# Patient Record
Sex: Male | Born: 2000 | Race: White | Hispanic: No | Marital: Single | Smoking: Former smoker
Health system: Southern US, Community
[De-identification: ages and names within clinical notes are randomized; demographics above are authoritative.]

## PROBLEM LIST (undated history)

## (undated) DIAGNOSIS — Q600 Renal agenesis, unilateral: Secondary | ICD-10-CM

## (undated) HISTORY — PX: TONSILLECTOMY: SUR1361

---
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
## 2021-03-20 IMAGING — DX DG CHEST 2V
2 series · 2 of 2 positions shown · non-contrast
Comparison: 08/13/2003

CLINICAL DATA: 19-year-old male with chest pain

EXAM:
CHEST - 2 VIEW

[chest pa]
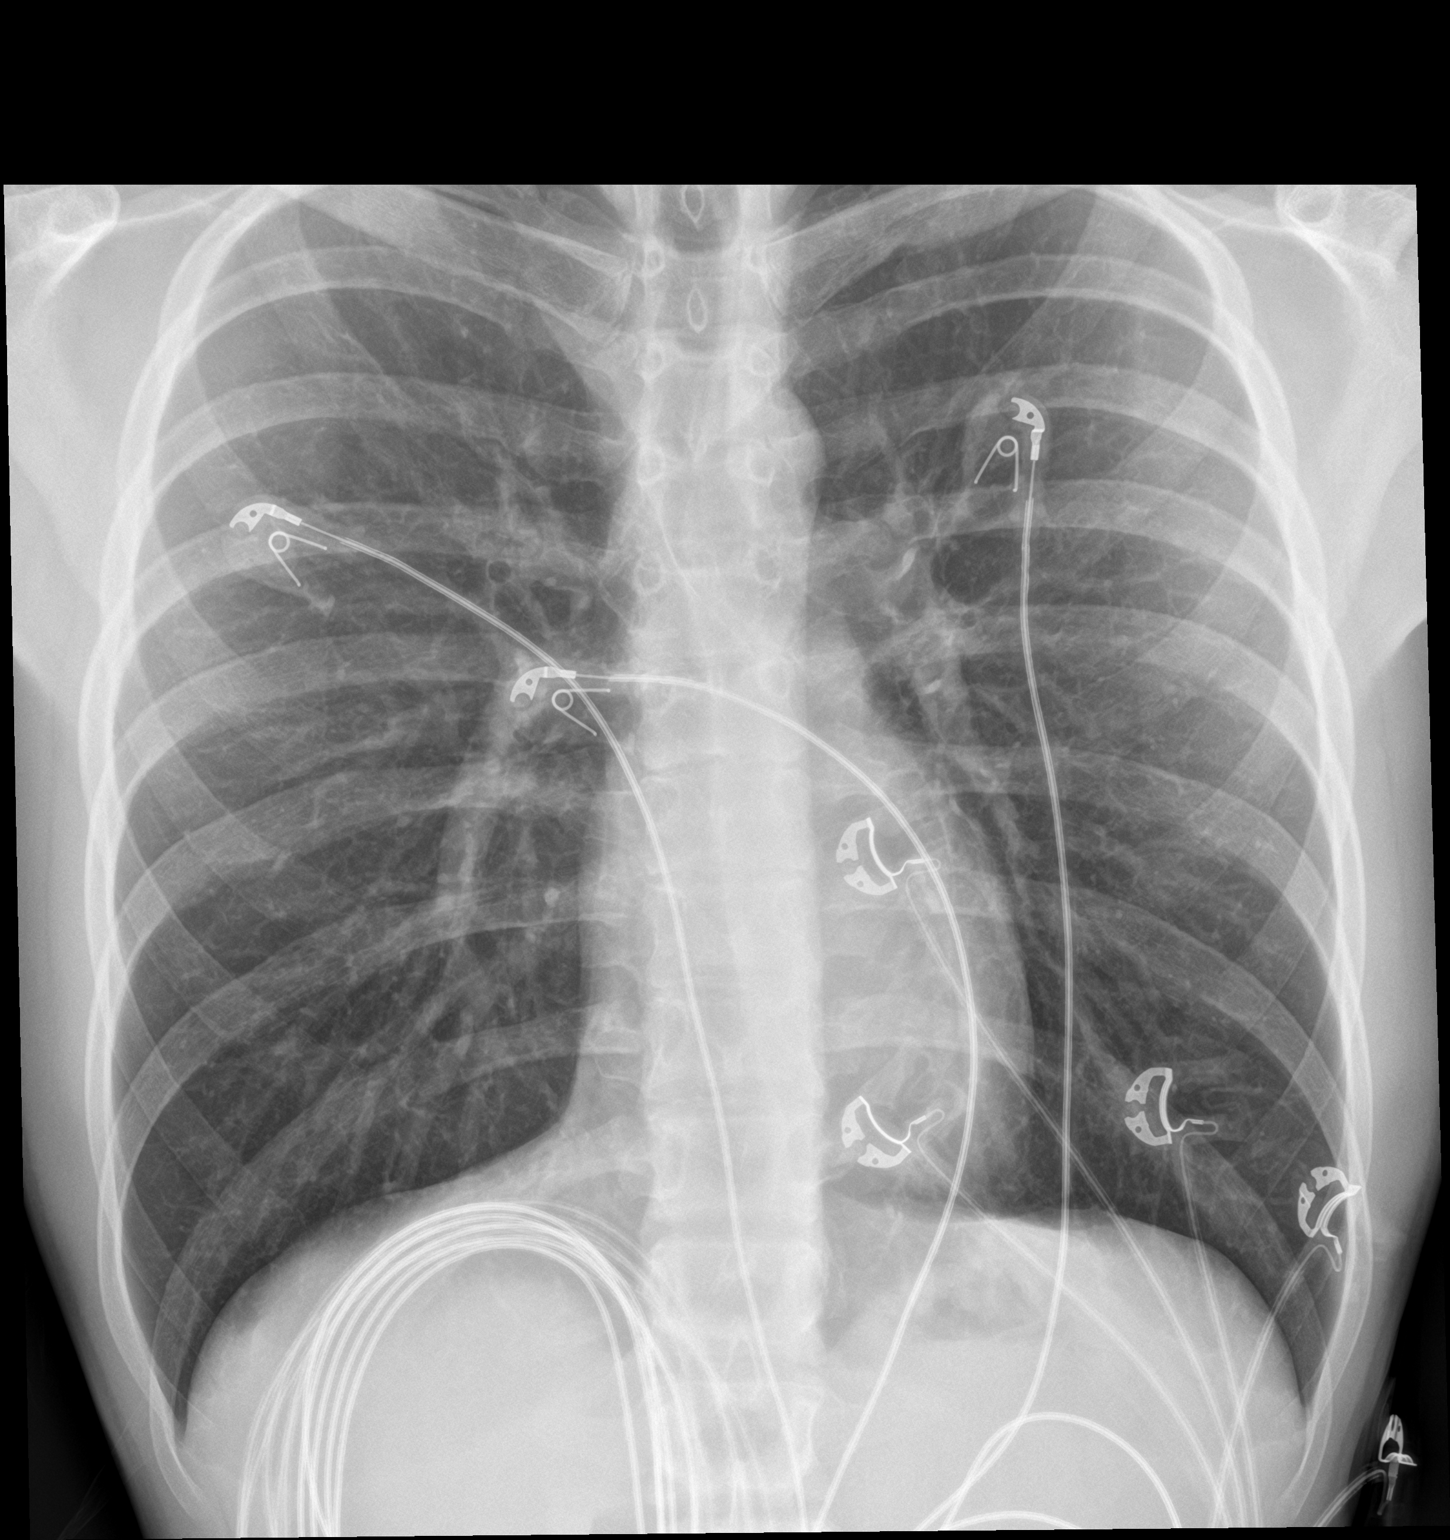

[chest lat]
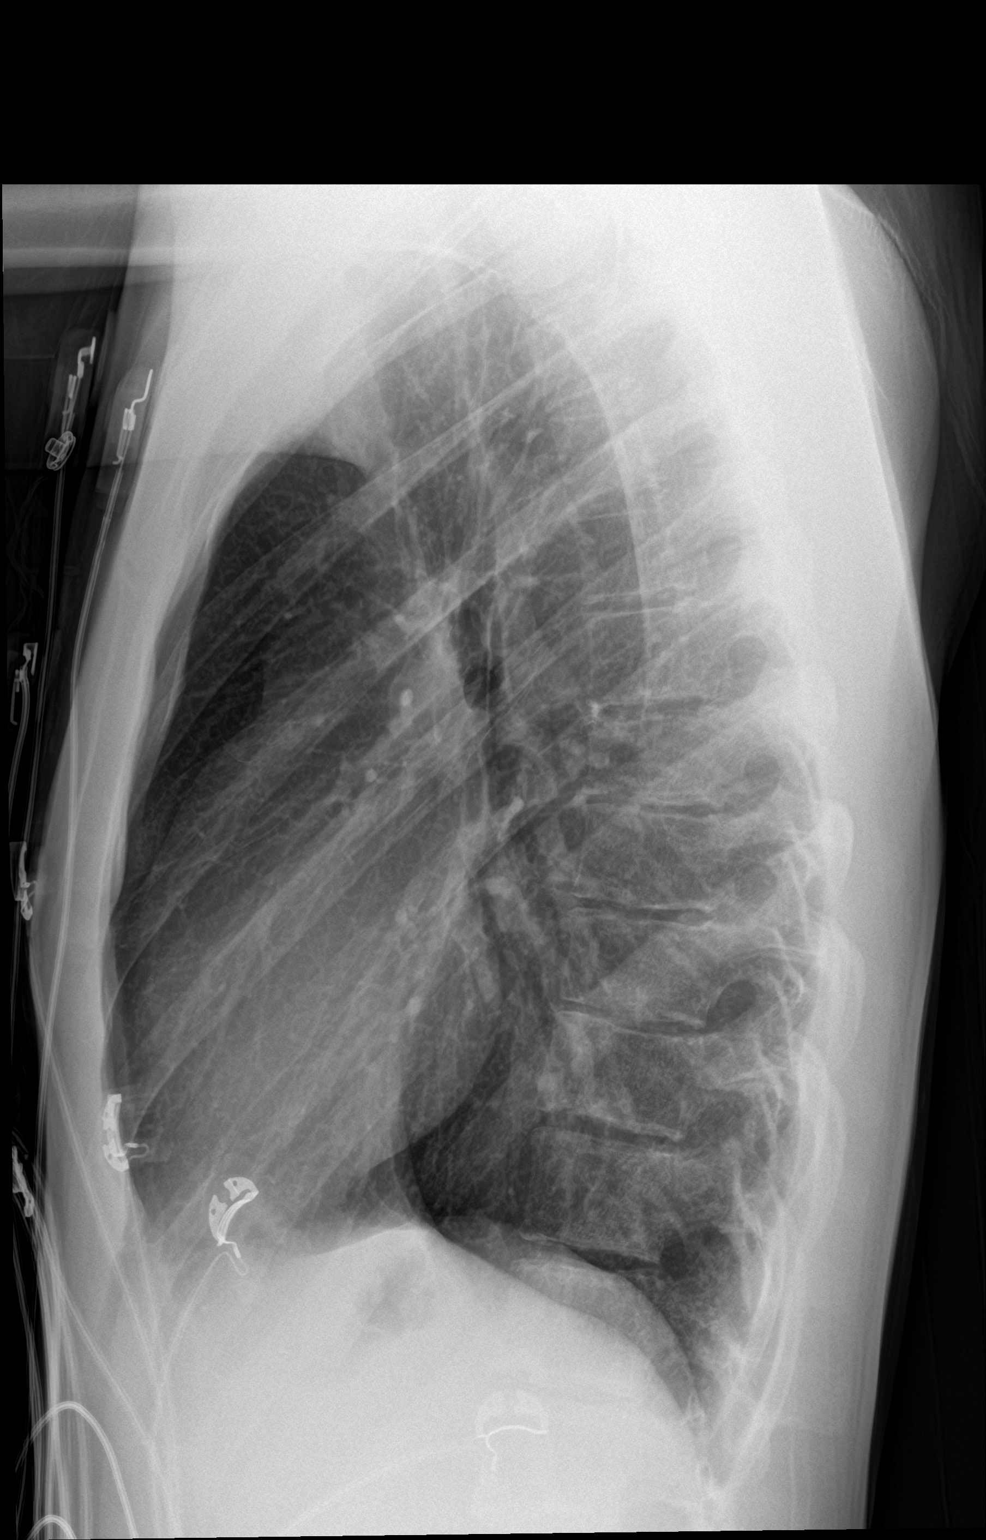

[2 of 2 positions shown; findings below may reference images not displayed]

FINDINGS: The heart size and mediastinal contours are within normal limits.
Both lungs are clear. The visualized skeletal structures are
unremarkable.
IMPRESSION: No active cardiopulmonary disease.

## 2023-01-26 DEATH — deceased
